# Patient Record
Sex: Male | Born: 1980 | Race: Black or African American | Hispanic: No
Health system: Southern US, Community
[De-identification: ages and names within clinical notes are randomized; demographics above are authoritative.]

---

## 2020-05-24 ENCOUNTER — Emergency Department (HOSPITAL_COMMUNITY): Payer: Medicaid - Out of State

## 2020-05-24 ENCOUNTER — Other Ambulatory Visit: Payer: Self-pay

## 2020-05-24 ENCOUNTER — Emergency Department (HOSPITAL_COMMUNITY)
Admission: EM | Admit: 2020-05-24 | Discharge: 2020-05-24 | Disposition: A | Discharge status: Home / Self Care | Payer: Medicaid - Out of State | Attending: Emergency Medicine | Admitting: Emergency Medicine

## 2020-05-24 DIAGNOSIS — R079 Chest pain, unspecified: Secondary | ICD-10-CM | POA: Diagnosis present

## 2020-05-24 DIAGNOSIS — R0781 Pleurodynia: Secondary | ICD-10-CM

## 2020-05-24 DIAGNOSIS — N289 Disorder of kidney and ureter, unspecified: Secondary | ICD-10-CM

## 2020-05-24 DIAGNOSIS — S2231XA Fracture of one rib, right side, initial encounter for closed fracture: Secondary | ICD-10-CM

## 2020-05-24 LAB — CBC
HCT: 42.9 % (ref 39.0–52.0)
Hemoglobin: 13.7 g/dL (ref 13.0–17.0)
MCH: 29.3 pg (ref 26.0–34.0)
MCHC: 31.9 g/dL (ref 30.0–36.0)
MCV: 91.7 fL (ref 80.0–100.0)
Platelets: 227 10*3/uL (ref 150–400)
RBC: 4.68 MIL/uL (ref 4.22–5.81)
RDW: 14.2 % (ref 11.5–15.5)
WBC: 10.1 10*3/uL (ref 4.0–10.5)
nRBC: 0 % (ref 0.0–0.2)

## 2020-05-24 LAB — BASIC METABOLIC PANEL
Anion gap: 10 (ref 5–15)
BUN: 17 mg/dL (ref 6–20)
CO2: 28 mmol/L (ref 22–32)
Calcium: 8.8 mg/dL — ABNORMAL LOW (ref 8.9–10.3)
Chloride: 103 mmol/L (ref 98–111)
Creatinine, Ser: 1.51 mg/dL — ABNORMAL HIGH (ref 0.61–1.24)
GFR calc Af Amer: >60 mL/min (ref >60)
GFR calc non Af Amer: 57 mL/min — ABNORMAL LOW (ref >60)
Glucose, Bld: 115 mg/dL — ABNORMAL HIGH (ref 70–99)
Potassium: 3.9 mmol/L (ref 3.5–5.1)
Sodium: 141 mmol/L (ref 135–145)

## 2020-05-24 LAB — TROPONIN I (HIGH SENSITIVITY)
Troponin I (High Sensitivity): 4 ng/L (ref <18)
Troponin I (High Sensitivity): 5 ng/L (ref <18)

## 2020-05-24 MED ORDER — LIDOCAINE 5 % EX PTCH: 1.0000 | MEDICATED_PATCH | CUTANEOUS | 0 refills | Status: AC

## 2020-05-24 MED ORDER — HYDROCODONE-ACETAMINOPHEN 5-325 MG PO TABS
1.0000 | ORAL_TABLET | Freq: Once | ORAL | Status: AC
  Administered 2020-05-24: 1 via ORAL
  Filled 2020-05-24: qty 1

## 2020-05-24 MED ORDER — IOHEXOL 350 MG/ML SOLN
75.0000 mL | Freq: Once | INTRAVENOUS | Status: AC | PRN
  Administered 2020-05-24: 75 mL via INTRAVENOUS

## 2020-05-24 MED ORDER — FENTANYL CITRATE (PF) 100 MCG/2ML IJ SOLN
25.0000 ug | Freq: Once | INTRAMUSCULAR | Status: AC
  Administered 2020-05-24: 25 ug via INTRAVENOUS
  Filled 2020-05-24: qty 2

## 2020-05-24 MED ORDER — SODIUM CHLORIDE 0.9% FLUSH: 3.0000 mL | Freq: Once | INTRAVENOUS | Status: DC

## 2020-05-24 MED ORDER — ONDANSETRON HCL 4 MG/2ML IJ SOLN
4.0000 mg | Freq: Once | INTRAMUSCULAR | Status: AC
  Administered 2020-05-24: 4 mg via INTRAVENOUS
  Filled 2020-05-24: qty 2

## 2020-05-24 MED ORDER — LIDOCAINE 5 % EX PTCH
1.0000 | MEDICATED_PATCH | CUTANEOUS | Status: DC
  Administered 2020-05-24: 1 via TRANSDERMAL
  Filled 2020-05-24: qty 1

## 2020-05-24 MED ORDER — HYDROCODONE-ACETAMINOPHEN 5-325 MG PO TABS: 1.0000 | ORAL_TABLET | Freq: Four times a day (QID) | ORAL | 0 refills | Status: AC | PRN

## 2020-05-24 MED ORDER — MORPHINE SULFATE (PF) 4 MG/ML IV SOLN
4.0000 mg | Freq: Once | INTRAVENOUS | Status: AC
  Administered 2020-05-24: 4 mg via INTRAVENOUS
  Filled 2020-05-24: qty 1

## 2020-05-24 NOTE — ED Notes (Signed)
Pt returned from CT °

## 2020-05-24 NOTE — ED Triage Notes (Signed)
Pt presents to ED BIB [facility]. Pt c/o R CP x2w. Pt states worsens w/ breathing, talking, and movement. Pt tachypnic in triage

## 2020-05-24 NOTE — ED Notes (Signed)
Patient verbalizes understanding of discharge instructions. Opportunity for questioning and answers were provided. Armband removed by staff, pt discharged from ED ambulatory.   

## 2020-05-24 NOTE — Discharge Instructions (Signed)
It appears that you have pneumonia which is most likely improving and you need to continue to take your antibiotic.  However today you also have a 7th rib fracture which is most likely the cause of your pain.  Rib fractures take 6-8 weeks to heal.  You may need to use a pillow to brace herself with coughing and you can take the pain medication as needed.  The pain medication can cause drowsiness and constipation she may need to take a stool softener to keep your stools regular.  You can also take ibuprofen as needed for the pain and use the lidocaine patches.  If you start running fever having significant shortness of breath or the pain becomes worse you need to go to your doctor or return to the emergency room.  No evidence of blood clot today but it did show that you have signs of chronic bronchitis and lung inflammation that needs a repeat CAT scan in about 3 months.  This can be done by your regular doctor.

## 2020-05-24 NOTE — ED Provider Notes (Signed)
[facility] EMERGENCY DEPARTMENT Provider Note   CSN: [id number] Arrival date & time: 05/24/20  0149   History Chief Complaint  Patient presents with  . Chest Pain    Nathaniel Terry is a 39 y.o. male.  The history is provided by the patient.  Chest Pain He was recently diagnosed with bronchitis and has been taking doxycycline and comes in because of right-sided chest pain which started last night.  He had just driven from [place] shortly before the pain started.  Pain is sharp and located on the right side of the chest inferiorly.  It is worse with movement and worse with taking a deep breath.  He denies any dyspnea, nausea, vomiting.  Pain is rated at 10/10.  He has been taking promethazine-dextromethorphan for the cough.  Cough is productive of some white sputum and has been improving since starting on the antibiotic.  He denies fever or chills.  He denies any trauma.  No past medical history on file.  There are no problems to display for this patient.   ** The histories are not reviewed yet. Please review them in the "History" navigator section and refresh this SmartLink.     No family history on file.  Social History   Tobacco Use  . Smoking status: Not on file  Substance Use Topics  . Alcohol use: Not on file  . Drug use: Not on file    Home Medications Prior to Admission medications   Medication Sig Start Date End Date Taking? Authorizing Provider  albuterol (PROVENTIL) (2.5 MG/3ML) 0.083% nebulizer solution Take 2.5 mg by nebulization every 4 (four) hours as needed for wheezing or shortness of breath.  05/14/20  Yes [provider]  albuterol (VENTOLIN HFA) 108 (90 Base) MCG/ACT inhaler Inhale 1-2 puffs into the lungs every 4 (four) hours as needed for wheezing or shortness of breath.  05/18/20  Yes [provider]  [employer] ALLERGY RELIEF 180 MG tablet Take 180 mg by mouth daily. 04/30/20  Yes [provider]  doxycycline  (MONODOX) 100 MG capsule Take 100 mg by mouth 2 (two) times daily. 05/18/20  Yes [provider]  montelukast (SINGULAIR) 10 MG tablet Take 10 mg by mouth daily. 05/22/20  Yes [provider]  naproxen (NAPROSYN) 500 MG tablet Take 500 mg by mouth 2 (two) times daily. 05/22/20  Yes [provider]  promethazine-dextromethorphan (PROMETHAZINE-DM) 6.25-15 MG/5ML syrup Take 5 mLs by mouth 4 (four) times daily as needed for cough.  05/18/20  Yes [provider]  Monte Fantasia INHUB 250-50 MCG/DOSE AEPB Inhale 1 puff into the lungs 2 (two) times daily. 05/14/20  Yes [provider]    Allergies    Patient has no known allergies.  Review of Systems   Review of Systems  Cardiovascular: Positive for chest pain.  All other systems reviewed and are negative.   Physical Exam Updated Vital Signs BP 129/78   Pulse 79   Temp 98.6 F (37 C) (Oral)   Resp 15   Ht 5\' 11"  (1.803 m)   Wt 95.3 kg   SpO2 95%   BMI 29.29 kg/m   Physical Exam Vitals and nursing note reviewed.   39 year old male, resting comfortably and in no acute distress. Vital signs are normal. Oxygen saturation is 95%, which is normal. Head is normocephalic and atraumatic. PERRLA, EOMI. Oropharynx is clear. Neck is nontender and supple without adenopathy or JVD. Back is nontender and there is no CVA  tenderness. Lungs have some scattered rhonchi at the right base without wheezes or rales. Chest is moderately tender in the right inferior and lateral rib cage.  There is no crepitus. Heart has regular rate and rhythm without murmur. Abdomen is soft, flat, nontender without masses or hepatosplenomegaly and peristalsis is normoactive. Extremities have no cyanosis or edema, full range of motion is present. Skin is warm and dry without rash. Neurologic: Mental status is normal, cranial nerves are intact, there are no motor or sensory deficits.  ED Results / Procedures / Treatments   Labs (all labs  ordered are listed, but only abnormal results are displayed) Labs Reviewed  BASIC METABOLIC PANEL - Abnormal; Notable for the following components:      Result Value   Glucose, Bld 115 (*)    Creatinine, Ser 1.51 (*)    Calcium 8.8 (*)    GFR calc non Af Amer 57 (*)    All other components within normal limits  CBC  TROPONIN I (HIGH SENSITIVITY)  TROPONIN I (HIGH SENSITIVITY)    EKG EKG Interpretation  Date/Time:  Thursday May 24 2020 01:57:20 EDT Ventricular Rate:  83 PR Interval:  136 QRS Duration: 86 QT Interval:  352 QTC Calculation: 413 R Axis:   8 Text Interpretation: Normal sinus rhythm Low voltage QRS Cannot rule out Anterior infarct , age undetermined Abnormal ECG No old tracing to compare Confirmed by Dione Booze ([id number]) on 05/24/2020 6:37:47 AM   Radiology DG Chest 2 View  Result Date: 05/24/2020 CLINICAL DATA:  Right-sided chest pain. EXAM: CHEST - 2 VIEW COMPARISON:  None. FINDINGS: There is an opacity in the right middle lobe. There is a smaller opacity at the left lung base. There is no acute osseous abnormality peer there is no pneumothorax. There may be emphysematous changes at the right lung apex. The heart size is normal. There is no pneumothorax. IMPRESSION: 1. Right middle lobe opacity concerning for pneumonia. Follow-up to radiologic resolution is recommended. 2. Small Passy at the left lung base may represent an additional infiltrate. 3. Possible emphysematous changes at the right lung apex. Attention on follow-up examinations is recommended. Electronically Signed   By: Katherine Mantle M.D.   On: 05/24/2020 02:50    Procedures Procedures   Medications Ordered in ED Medications  sodium chloride flush (NS) 0.9 % injection 3 mL (has no administration in time range)    ED Course  I have reviewed the triage vital signs and the nursing notes.  Pertinent labs & imaging results that were available during my care of the patient were reviewed by me and  considered in my medical decision making (see chart for details).  MDM Rules/Calculators/A&P Pleuritic chest pain and patient who was recently diagnosed with bronchitis, just completed a long car ride.  There is significant concern for pulmonary embolism because of long car ride.  Also, consider pneumonia, rib fracture secondary to cough.  Chest x-ray is read by radiologist as showing right lower lobe pneumonia with possible area of possible pneumonia at the left base.  On reviewing the images, the radiologist is pointing to a wedge shaped density at the right base which increases my concern for pulmonary embolism.  ECG shows no acute changes.  WBC is normal.  Mild renal insufficiency is present but not so severe as to preclude CT angiogram.  He will be sent for CT angiogram of the chest to look for evidence of pulmonary embolism, also for possible rib fracture.  Old records were reviewed,  and he has no relevant past visits.  7:37 AM CT angiogram is still pending.  Case is signed out to Dr. Maryan Rued.  Final Clinical Impression(s) / ED Diagnoses Final diagnoses:  Pleuritic chest pain  Renal insufficiency    Rx / DC Orders ED Discharge Orders    None       Delora Fuel, MD 68/12/75 [phone]

## 2020-05-24 NOTE — ED Provider Notes (Signed)
Patient checked out at 7:30 AM by Dr. Preston Fleeting with persistent right-sided chest pain.  Patient reports he has been treated for pneumonia and been improving on doxycycline but then developed acute onset of right-sided chest pain that is pleuritic.  Patient given morphine with some improvement in pain.  CTA to rule out clot as he recently had a long car trip.  On CT no evidence of PE is present however patient does have a new seventh right rib fracture which is exactly where he is having pain.  This is most likely a result of coughing.  He does appear to have pneumonia but currently is undergoing treatment and reports symptoms were in general improving do not feel that he needs further antibiotic treatment as he is afebrile, labs are reassuring and he is here mostly for pain and not related to productive cough or shortness of breath.  Patient was given report of CT, pain control and strict return precautions.   Gwyneth Sprout, MD 05/24/20 1148

## 2020-10-22 IMAGING — CT CT ANGIO CHEST
2 of 7 series · 18 of 46 positions shown · IV contrast (omnipaque)
Comparison: Plain film of earlier today.

CLINICAL DATA: Chest pain for 2 weeks. Evaluate for pulmonary
embolism.

EXAM:
CT ANGIOGRAPHY CHEST WITH CONTRAST
TECHNIQUE: Multidetector CT imaging of the chest was performed using the
standard protocol during bolus administration of intravenous
contrast. Multiplanar CT image reconstructions and MIPs were
obtained to evaluate the vascular anatomy.
CONTRAST:  75mL OMNIPAQUE IOHEXOL 350 MG/ML SOLN

[Series 7: thins · axial · 0.72mm/px · z∈[-195,+85]mm · 15 of 450 slices shown]
[im 25/450  lung]
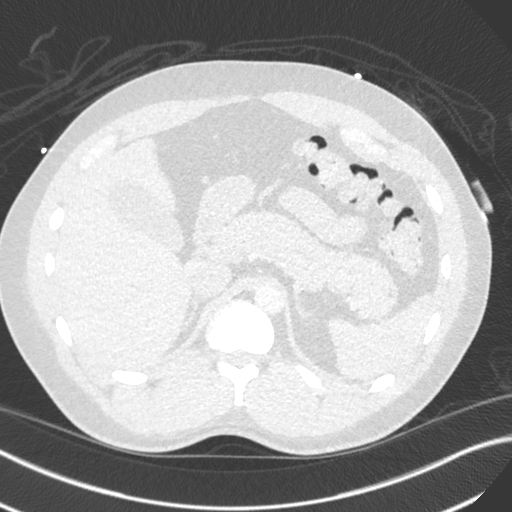
[im 50/450  soft-tissue]
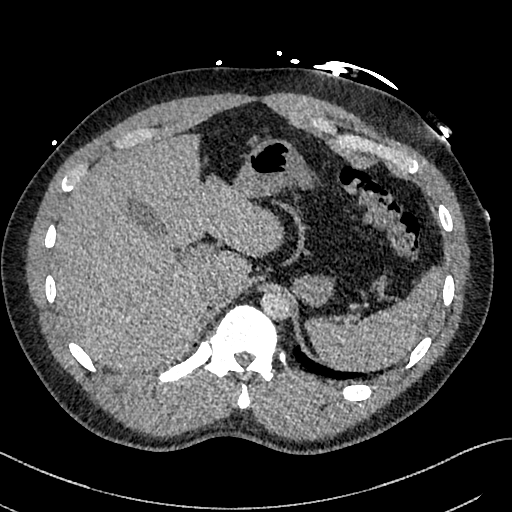
[im 75/450  lung]
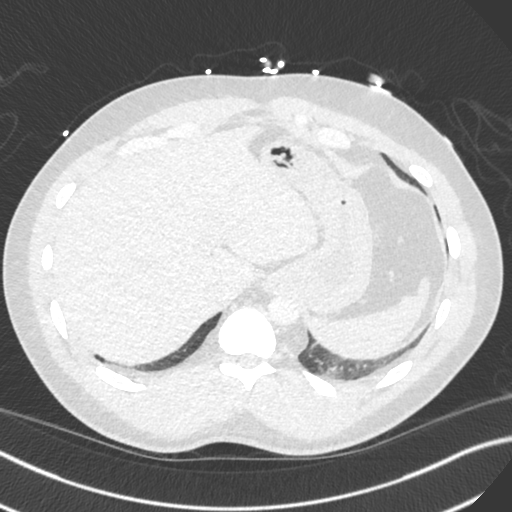
[im 100/450  soft-tissue]
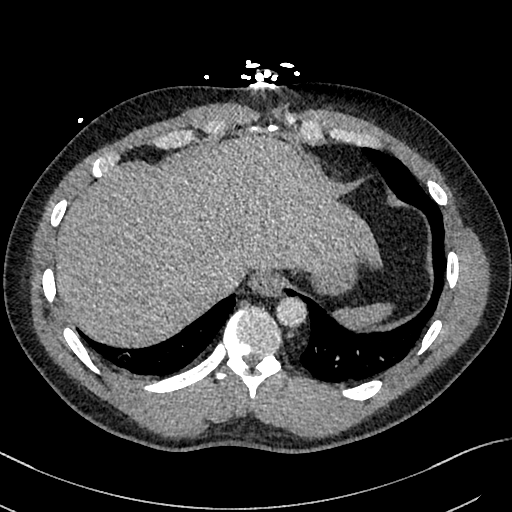
[im 150/450  lung]
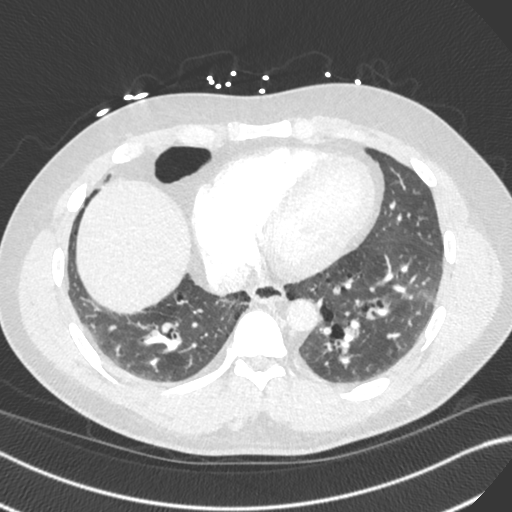
[im 175/450  soft-tissue]
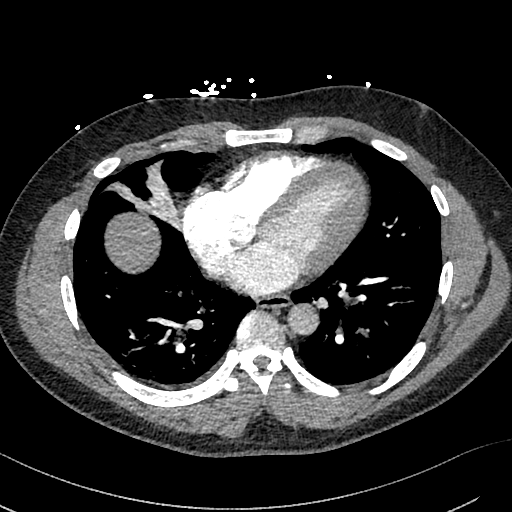
[im 200/450  lung]
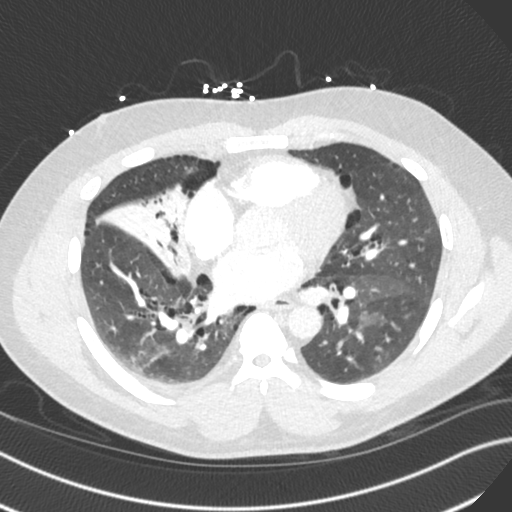
[im 225/450  soft-tissue]
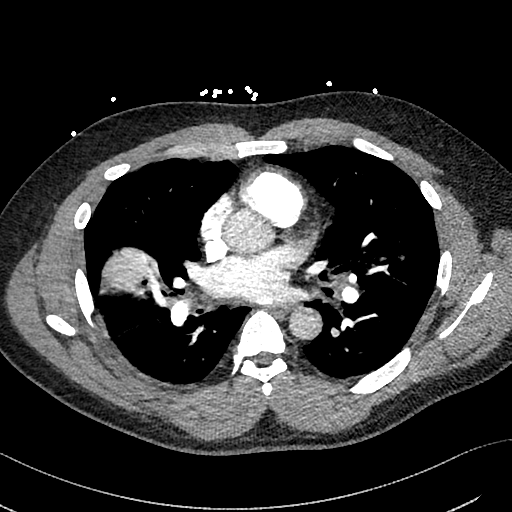
[im 250/450  lung]
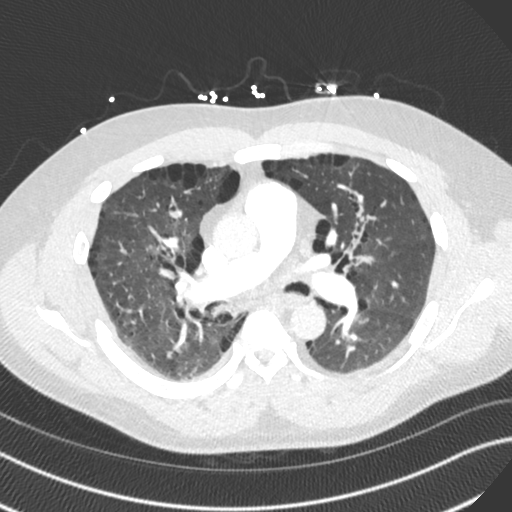
[im 275/450  soft-tissue]
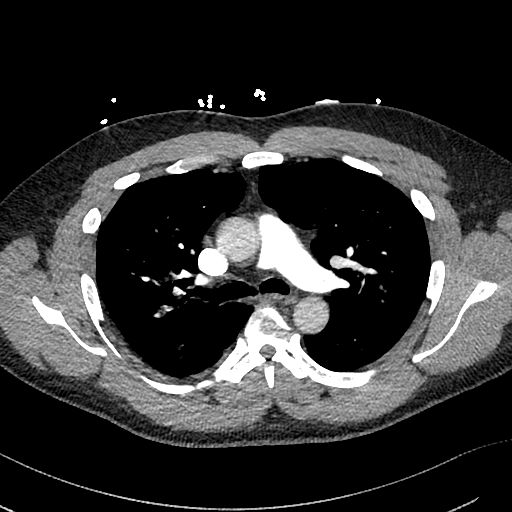
[im 300/450  lung]
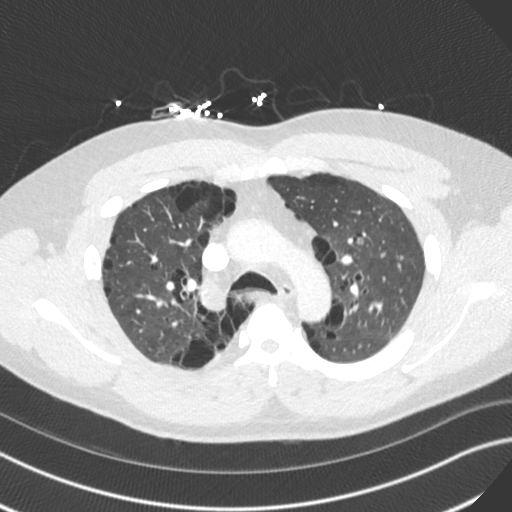
[im 350/450  soft-tissue]
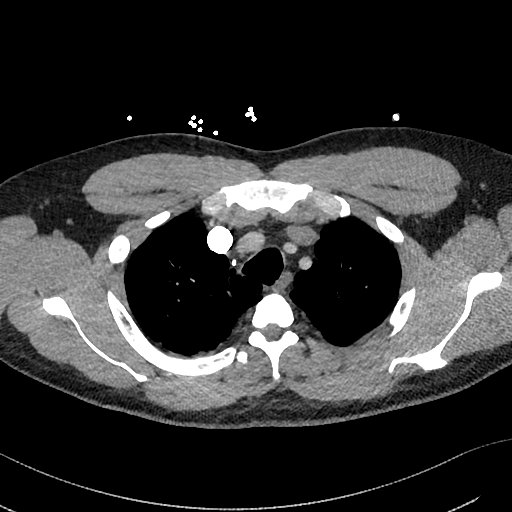
[im 375/450  lung]
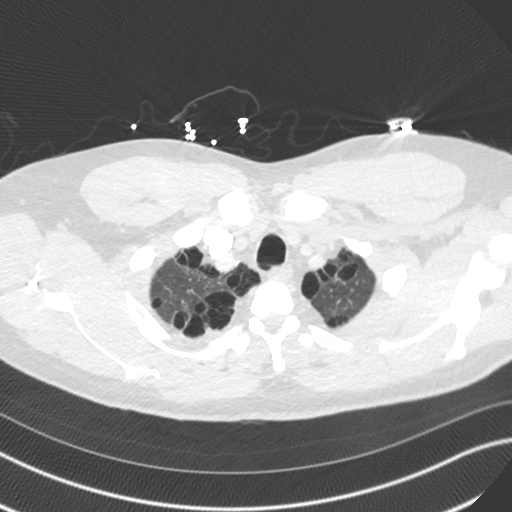
[im 400/450  soft-tissue]
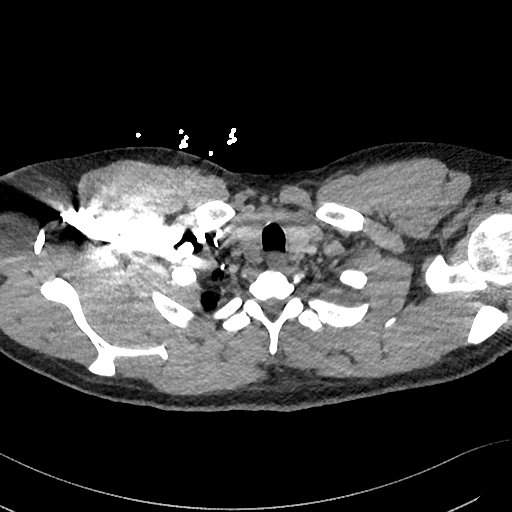
[im 425/450  lung]
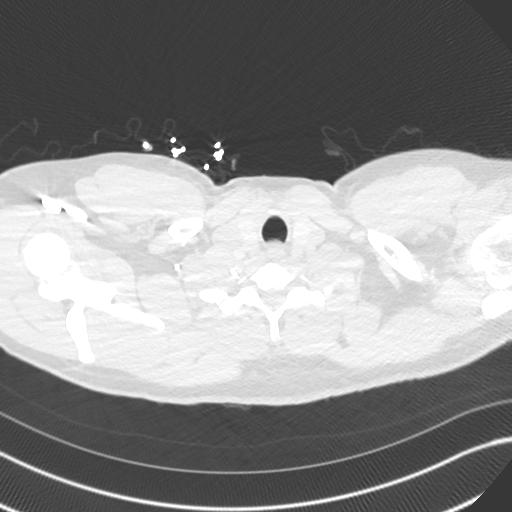

[Series 8: cor · coronal · 0.61mm/px · 3 of 145 slices shown]
[im 37/145  soft-tissue]
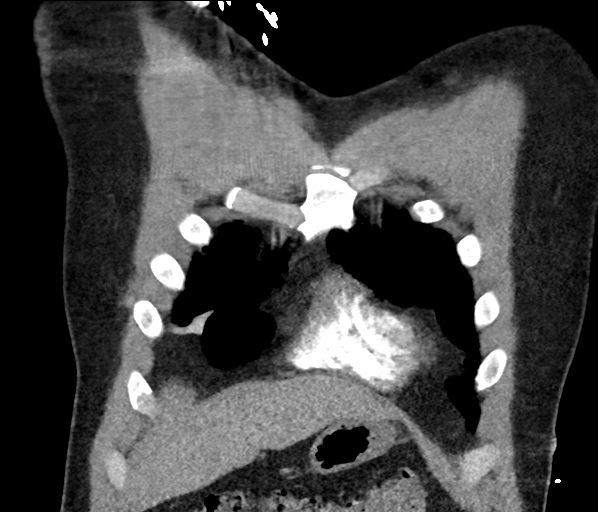
[im 73/145  soft-tissue]
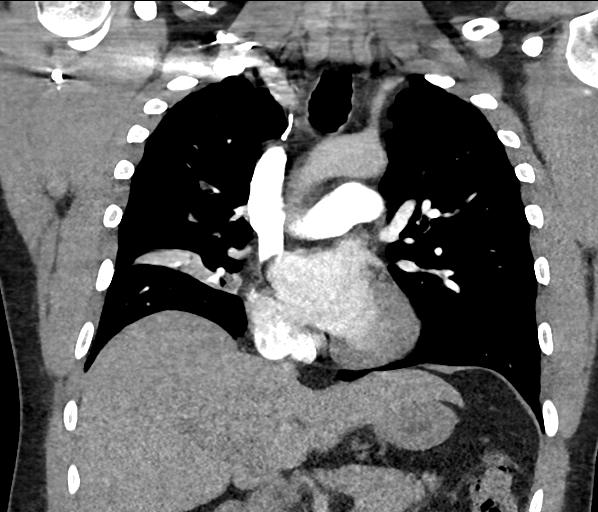
[im 109/145  soft-tissue]
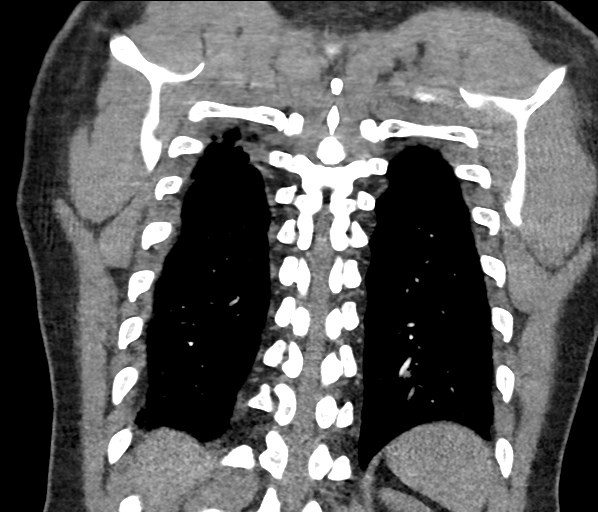

[18 of 46 positions shown; findings below may reference images not displayed]

FINDINGS: Cardiovascular: The quality of this exam for evaluation of pulmonary
embolism is good. No evidence of pulmonary embolism.

Tortuous thoracic aorta. Normal heart size, without pericardial
effusion.

Mediastinum/Nodes: No mediastinal or hilar adenopathy.

Lungs/Pleura: Trace right pleural thickening. Advanced centrilobular
and paraseptal emphysema.

Extensive, bilateral areas of bronchiectasis and mucoid impaction.

Peribronchovascular nodularity within the right upper lobe,
including at 6 mm on 72/6.

Left upper lobe peribronchovascular nodularity, including at 6 mm on
79/6.

Right middle lobe collapse/consolidation. No well-defined centrally
obstructive lesion. The central right middle lobe bronchus is likely
compressed on 86/6.

Upper Abdomen: Normal imaged portions of the liver, spleen, stomach,
pancreas, gallbladder, adrenal glands, right kidney. Possible
punctate upper pole left renal collecting system calculus.

Musculoskeletal: Seventh posterolateral right rib fracture on 74/5.

Review of the MIP images confirms the above findings.
IMPRESSION: 1.  No evidence of pulmonary embolism.
2. Right seventh rib fracture, acute/subacute.
3. Right middle lobe collapse/consolidation, favored to be
indicative of pneumonia.
4. Extensive chronic lung disease, including areas of
bronchiectasis, peribronchovascular nodularity, mucoid impaction.
Likely the sequelae of chronic infection/inflammation. Recommend CT
follow-up at 3 months to confirm resolution of the right middle lobe
process and resolution or stability of the pulmonary nodularity.
5.  Emphysema ([id number]).
6. Possible left nephrolithiasis.

## 2020-10-22 IMAGING — CR DG CHEST 2V
2 series · 2 of 2 positions shown · non-contrast
Comparison: None.

CLINICAL DATA: Right-sided chest pain.

EXAM:
CHEST - 2 VIEW

[chest pa]
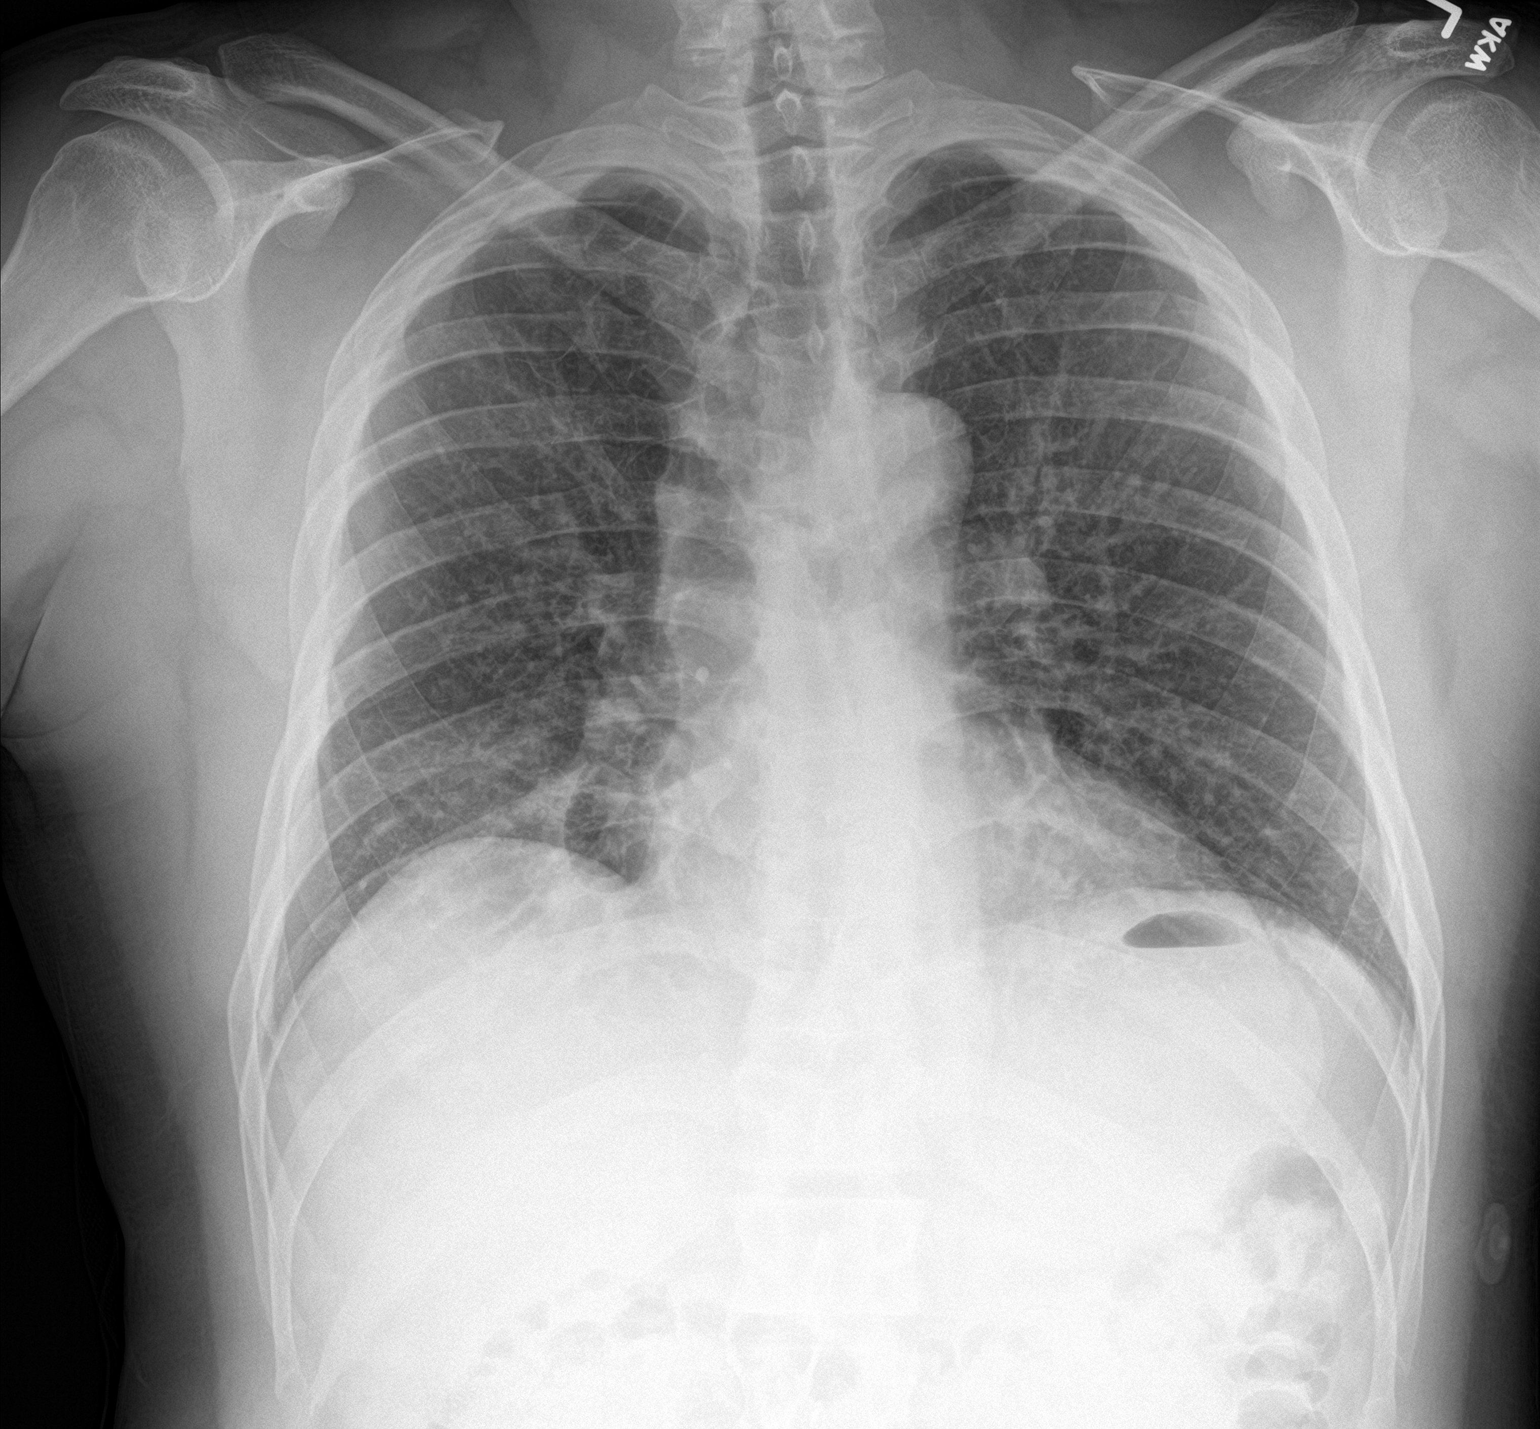

[chest lat]
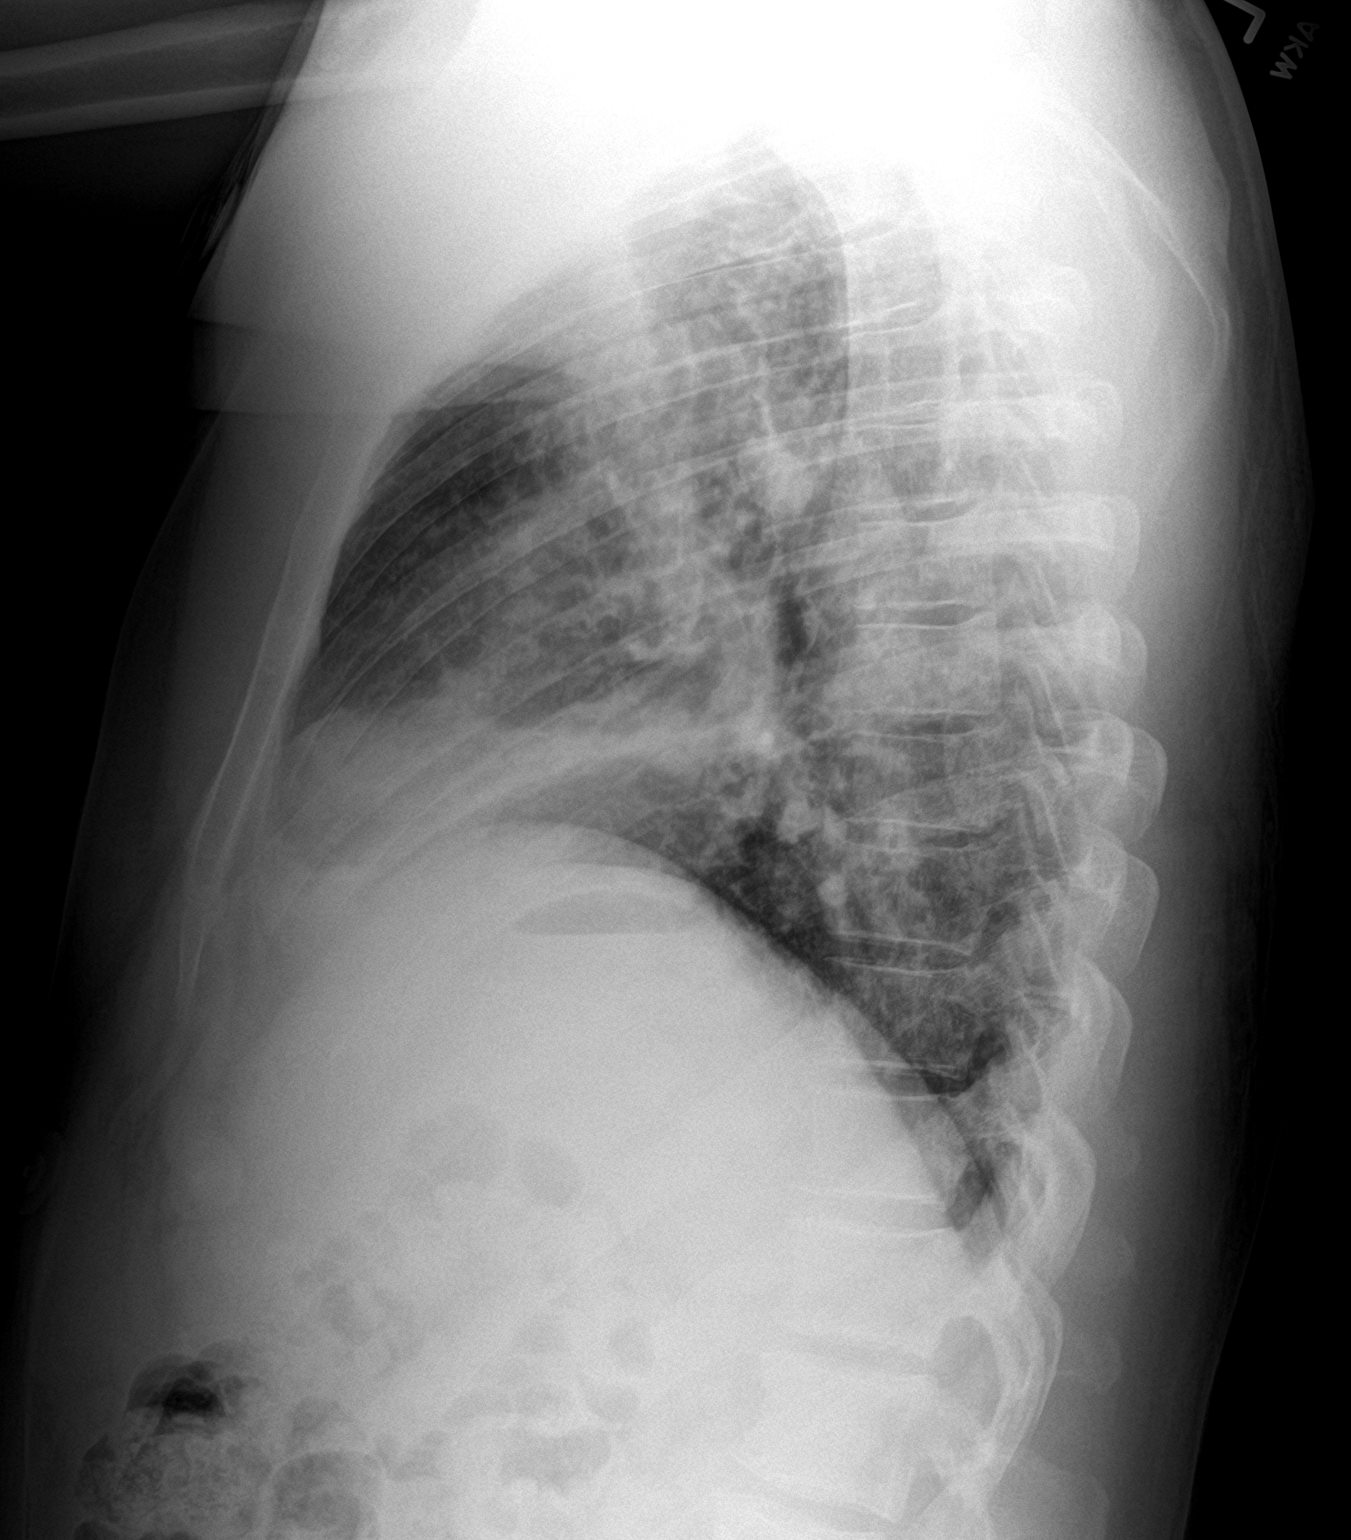

[2 of 2 positions shown; findings below may reference images not displayed]

FINDINGS: There is an opacity in the right middle lobe. There is a smaller
opacity at the left lung base. There is no acute osseous abnormality
peer there is no pneumothorax. There may be emphysematous changes at
the right lung apex. The heart size is normal. There is no
pneumothorax.
IMPRESSION: 1. Right middle lobe opacity concerning for pneumonia. Follow-up to
radiologic resolution is recommended.
2. Ramakrishna Lopez Martinez at the left lung base may represent an additional
infiltrate.
3. Possible emphysematous changes at the right lung apex. Attention
on follow-up examinations is recommended.
